# Patient Record
Sex: Female | Born: 1965 | Race: White | Hispanic: No | Marital: Married | Smoking: Never smoker
Health system: Southern US, Community
[De-identification: ages and names within clinical notes are randomized; demographics above are authoritative.]

## PROBLEM LIST (undated history)

## (undated) DIAGNOSIS — K279 Peptic ulcer, site unspecified, unspecified as acute or chronic, without hemorrhage or perforation: Secondary | ICD-10-CM

## (undated) DIAGNOSIS — Z9109 Other allergy status, other than to drugs and biological substances: Secondary | ICD-10-CM

## (undated) HISTORY — PX: BACK SURGERY: SHX140

---

## 1999-08-01 ENCOUNTER — Other Ambulatory Visit: Admission: RE | Admit: 1999-08-01 | Discharge: 1999-08-01 | Payer: Self-pay | Admitting: Obstetrics and Gynecology

## 2000-06-27 ENCOUNTER — Inpatient Hospital Stay (HOSPITAL_COMMUNITY): Admission: RE | Admit: 2000-06-27 | Discharge: 2000-06-28 | Payer: Self-pay | Admitting: Obstetrics and Gynecology

## 2001-08-24 ENCOUNTER — Other Ambulatory Visit: Admission: RE | Admit: 2001-08-24 | Discharge: 2001-08-24 | Payer: Self-pay | Admitting: Internal Medicine

## 2007-09-17 ENCOUNTER — Encounter: Admission: RE | Admit: 2007-09-17 | Discharge: 2007-09-17 | Payer: Self-pay | Admitting: Obstetrics and Gynecology

## 2007-09-22 ENCOUNTER — Encounter: Admission: RE | Admit: 2007-09-22 | Discharge: 2007-09-22 | Payer: Self-pay | Admitting: Obstetrics and Gynecology

## 2008-08-11 ENCOUNTER — Encounter: Admission: RE | Admit: 2008-08-11 | Discharge: 2008-08-11 | Payer: Self-pay | Admitting: Obstetrics and Gynecology

## 2011-01-06 ENCOUNTER — Encounter: Payer: Self-pay | Admitting: Obstetrics and Gynecology

## 2011-04-16 ENCOUNTER — Other Ambulatory Visit: Payer: Self-pay | Admitting: Obstetrics and Gynecology

## 2011-04-16 DIAGNOSIS — N6320 Unspecified lump in the left breast, unspecified quadrant: Secondary | ICD-10-CM

## 2011-04-18 ENCOUNTER — Ambulatory Visit
Admission: RE | Admit: 2011-04-18 | Discharge: 2011-04-18 | Disposition: A | Discharge status: Home / Self Care | Payer: BC Managed Care – PPO | Source: Ambulatory Visit | Attending: Obstetrics and Gynecology | Admitting: Obstetrics and Gynecology

## 2011-04-18 ENCOUNTER — Other Ambulatory Visit: Payer: Self-pay | Admitting: Obstetrics and Gynecology

## 2011-04-18 DIAGNOSIS — N6320 Unspecified lump in the left breast, unspecified quadrant: Secondary | ICD-10-CM

## 2012-03-26 ENCOUNTER — Other Ambulatory Visit: Payer: Self-pay | Admitting: Obstetrics and Gynecology

## 2012-03-26 DIAGNOSIS — Z1231 Encounter for screening mammogram for malignant neoplasm of breast: Secondary | ICD-10-CM

## 2012-04-21 ENCOUNTER — Ambulatory Visit
Admission: RE | Admit: 2012-04-21 | Discharge: 2012-04-21 | Disposition: A | Discharge status: Home / Self Care | Payer: BC Managed Care – PPO | Source: Ambulatory Visit | Attending: Obstetrics and Gynecology | Admitting: Obstetrics and Gynecology

## 2012-04-21 DIAGNOSIS — Z1231 Encounter for screening mammogram for malignant neoplasm of breast: Secondary | ICD-10-CM

## 2013-05-22 DIAGNOSIS — E669 Obesity, unspecified: Secondary | ICD-10-CM | POA: Insufficient documentation

## 2013-05-22 DIAGNOSIS — M51379 Other intervertebral disc degeneration, lumbosacral region without mention of lumbar back pain or lower extremity pain: Secondary | ICD-10-CM | POA: Insufficient documentation

## 2013-05-22 DIAGNOSIS — M545 Low back pain, unspecified: Secondary | ICD-10-CM | POA: Insufficient documentation

## 2013-05-22 DIAGNOSIS — R05 Cough: Secondary | ICD-10-CM | POA: Insufficient documentation

## 2013-05-22 DIAGNOSIS — J019 Acute sinusitis, unspecified: Secondary | ICD-10-CM | POA: Insufficient documentation

## 2013-05-22 DIAGNOSIS — M79609 Pain in unspecified limb: Secondary | ICD-10-CM | POA: Insufficient documentation

## 2013-05-22 DIAGNOSIS — G8929 Other chronic pain: Secondary | ICD-10-CM | POA: Insufficient documentation

## 2013-05-22 DIAGNOSIS — M539 Dorsopathy, unspecified: Secondary | ICD-10-CM | POA: Insufficient documentation

## 2013-05-22 DIAGNOSIS — J209 Acute bronchitis, unspecified: Secondary | ICD-10-CM | POA: Insufficient documentation

## 2013-05-22 DIAGNOSIS — J45909 Unspecified asthma, uncomplicated: Secondary | ICD-10-CM | POA: Insufficient documentation

## 2013-05-22 DIAGNOSIS — M5137 Other intervertebral disc degeneration, lumbosacral region: Secondary | ICD-10-CM | POA: Insufficient documentation

## 2013-05-22 DIAGNOSIS — R519 Headache, unspecified: Secondary | ICD-10-CM | POA: Insufficient documentation

## 2013-05-22 DIAGNOSIS — K219 Gastro-esophageal reflux disease without esophagitis: Secondary | ICD-10-CM | POA: Insufficient documentation

## 2013-05-22 DIAGNOSIS — M5417 Radiculopathy, lumbosacral region: Secondary | ICD-10-CM | POA: Insufficient documentation

## 2013-05-22 DIAGNOSIS — S93609A Unspecified sprain of unspecified foot, initial encounter: Secondary | ICD-10-CM | POA: Insufficient documentation

## 2013-05-22 DIAGNOSIS — M76899 Other specified enthesopathies of unspecified lower limb, excluding foot: Secondary | ICD-10-CM | POA: Insufficient documentation

## 2013-05-22 DIAGNOSIS — S39012A Strain of muscle, fascia and tendon of lower back, initial encounter: Secondary | ICD-10-CM | POA: Insufficient documentation

## 2013-07-14 ENCOUNTER — Other Ambulatory Visit: Payer: Self-pay

## 2013-07-14 DIAGNOSIS — Z1231 Encounter for screening mammogram for malignant neoplasm of breast: Secondary | ICD-10-CM

## 2013-08-12 ENCOUNTER — Ambulatory Visit: Payer: BC Managed Care – PPO

## 2013-09-01 ENCOUNTER — Ambulatory Visit
Admission: RE | Admit: 2013-09-01 | Discharge: 2013-09-01 | Disposition: A | Discharge status: Home / Self Care | Payer: BC Managed Care – PPO | Source: Ambulatory Visit

## 2013-09-01 DIAGNOSIS — Z1231 Encounter for screening mammogram for malignant neoplasm of breast: Secondary | ICD-10-CM

## 2014-01-11 ENCOUNTER — Other Ambulatory Visit: Payer: Self-pay | Admitting: Neurological Surgery

## 2014-01-11 DIAGNOSIS — M5416 Radiculopathy, lumbar region: Secondary | ICD-10-CM

## 2014-01-14 ENCOUNTER — Ambulatory Visit
Admission: RE | Admit: 2014-01-14 | Discharge: 2014-01-14 | Disposition: A | Discharge status: Home / Self Care | Payer: BC Managed Care – PPO | Source: Ambulatory Visit | Attending: Neurological Surgery | Admitting: Neurological Surgery

## 2014-01-14 VITALS — BP 141/85 | HR 83

## 2014-01-14 DIAGNOSIS — M5416 Radiculopathy, lumbar region: Secondary | ICD-10-CM

## 2014-01-14 MED ORDER — IOHEXOL 180 MG/ML  SOLN
1.0000 mL | Freq: Once | INTRAMUSCULAR | Status: AC | PRN
  Administered 2014-01-14: 1 mL via EPIDURAL

## 2014-01-14 MED ORDER — METHYLPREDNISOLONE ACETATE 40 MG/ML INJ SUSP (RADIOLOG
120.0000 mg | Freq: Once | INTRAMUSCULAR | Status: AC
  Administered 2014-01-14: 120 mg via EPIDURAL

## 2014-01-14 NOTE — Discharge Instructions (Signed)

## 2014-01-14 NOTE — Progress Notes (Deleted)
Patient states she has been off Citalopram, Phenergan and Tramadol for the past two days.

## 2014-07-16 DIAGNOSIS — K047 Periapical abscess without sinus: Secondary | ICD-10-CM | POA: Insufficient documentation

## 2014-11-25 ENCOUNTER — Other Ambulatory Visit: Payer: Self-pay | Admitting: Obstetrics and Gynecology

## 2014-11-25 DIAGNOSIS — R928 Other abnormal and inconclusive findings on diagnostic imaging of breast: Secondary | ICD-10-CM

## 2014-12-14 ENCOUNTER — Ambulatory Visit
Admission: RE | Admit: 2014-12-14 | Discharge: 2014-12-14 | Disposition: A | Discharge status: Home / Self Care | Payer: BC Managed Care – PPO | Source: Ambulatory Visit | Attending: Obstetrics and Gynecology | Admitting: Obstetrics and Gynecology

## 2014-12-14 DIAGNOSIS — R928 Other abnormal and inconclusive findings on diagnostic imaging of breast: Secondary | ICD-10-CM

## 2015-04-12 ENCOUNTER — Other Ambulatory Visit: Payer: Self-pay | Admitting: Neurological Surgery

## 2015-04-12 DIAGNOSIS — M5416 Radiculopathy, lumbar region: Secondary | ICD-10-CM

## 2015-04-21 ENCOUNTER — Other Ambulatory Visit: Payer: Self-pay

## 2015-04-24 ENCOUNTER — Other Ambulatory Visit: Payer: Self-pay

## 2015-05-05 ENCOUNTER — Ambulatory Visit
Admission: RE | Admit: 2015-05-05 | Discharge: 2015-05-05 | Disposition: A | Discharge status: Home / Self Care | Payer: BLUE CROSS/BLUE SHIELD | Source: Ambulatory Visit | Attending: Neurological Surgery | Admitting: Neurological Surgery

## 2015-05-05 ENCOUNTER — Ambulatory Visit
Admission: RE | Admit: 2015-05-05 | Discharge: 2015-05-05 | Disposition: A | Discharge status: Home / Self Care | Payer: Self-pay | Source: Ambulatory Visit | Attending: Neurological Surgery | Admitting: Neurological Surgery

## 2015-05-05 VITALS — BP 113/69 | HR 85

## 2015-05-05 DIAGNOSIS — M5416 Radiculopathy, lumbar region: Secondary | ICD-10-CM

## 2015-05-05 DIAGNOSIS — M5417 Radiculopathy, lumbosacral region: Principal | ICD-10-CM

## 2015-05-05 DIAGNOSIS — M5414 Radiculopathy, thoracic region: Secondary | ICD-10-CM

## 2015-05-05 MED ORDER — MEPERIDINE HCL 100 MG/ML IJ SOLN
75.0000 mg | Freq: Once | INTRAMUSCULAR | Status: AC
  Administered 2015-05-05: 75 mg via INTRAMUSCULAR

## 2015-05-05 MED ORDER — DIAZEPAM 5 MG PO TABS
10.0000 mg | ORAL_TABLET | Freq: Once | ORAL | Status: AC
  Administered 2015-05-05: 10 mg via ORAL

## 2015-05-05 MED ORDER — IOHEXOL 180 MG/ML  SOLN
15.0000 mL | Freq: Once | INTRAMUSCULAR | Status: AC | PRN
  Administered 2015-05-05: 15 mL via INTRATHECAL

## 2015-05-05 MED ORDER — ONDANSETRON HCL 4 MG/2ML IJ SOLN
4.0000 mg | Freq: Once | INTRAMUSCULAR | Status: AC
  Administered 2015-05-05: 4 mg via INTRAMUSCULAR

## 2015-05-05 NOTE — Discharge Instructions (Signed)

## 2015-11-15 ENCOUNTER — Emergency Department (HOSPITAL_COMMUNITY)
Admission: EM | Admit: 2015-11-15 | Discharge: 2015-11-15 | Disposition: A | Discharge status: Home / Self Care | Payer: BLUE CROSS/BLUE SHIELD | Source: Home / Self Care | Attending: Emergency Medicine | Admitting: Emergency Medicine

## 2015-11-15 ENCOUNTER — Encounter (HOSPITAL_COMMUNITY): Payer: Self-pay | Admitting: Emergency Medicine

## 2015-11-15 DIAGNOSIS — J018 Other acute sinusitis: Secondary | ICD-10-CM | POA: Diagnosis not present

## 2015-11-15 HISTORY — DX: Peptic ulcer, site unspecified, unspecified as acute or chronic, without hemorrhage or perforation: K27.9

## 2015-11-15 HISTORY — DX: Other allergy status, other than to drugs and biological substances: Z91.09

## 2015-11-15 MED ORDER — PREDNISONE 50 MG PO TABS: ORAL_TABLET | ORAL | Status: DC

## 2015-11-15 MED ORDER — ALBUTEROL SULFATE HFA 108 (90 BASE) MCG/ACT IN AERS: 2.0000 | INHALATION_SPRAY | RESPIRATORY_TRACT | Status: AC | PRN

## 2015-11-15 MED ORDER — FLUNISOLIDE 25 MCG/ACT (0.025%) NA SOLN: 2.0000 | Freq: Two times a day (BID) | NASAL | Status: AC

## 2015-11-15 MED ORDER — FLUTICASONE PROPIONATE 50 MCG/ACT NA SUSP: 2.0000 | Freq: Every day | NASAL | Status: DC

## 2015-11-15 MED ORDER — GUAIFENESIN ER 600 MG PO TB12: 600.0000 mg | ORAL_TABLET | Freq: Two times a day (BID) | ORAL | Status: AC

## 2015-11-15 MED ORDER — HYDROCODONE-HOMATROPINE 5-1.5 MG/5ML PO SYRP: 5.0000 mL | ORAL_SOLUTION | Freq: Four times a day (QID) | ORAL | Status: AC | PRN

## 2015-11-15 MED ORDER — ALBUTEROL SULFATE HFA 108 (90 BASE) MCG/ACT IN AERS: 2.0000 | INHALATION_SPRAY | RESPIRATORY_TRACT | Status: DC | PRN

## 2015-11-15 NOTE — ED Provider Notes (Signed)
CSN: [id number]     Arrival date & time 11/15/15  1259 History   First MD Initiated Contact with Patient 11/15/15 1320     Chief Complaint  Patient presents with  . Recurrent Sinusitis   (Consider location/radiation/quality/duration/timing/severity/associated sxs/prior Treatment) HPI She is a 49 year old woman here for evaluation of sinus infection. She states she gets about 2 sinus infections a year. Her primary care provider recently retired, and she does not have a new one yet. Her symptoms started 3 days ago with postnasal drainage, headache, and cough. She reports some wheezing at night. The cough is productive of a small amount of sputum. She reports subjective fevers yesterday and this morning. She denies any rhinorrhea. She does report a mild sore throat. She states her ears feel full.  She states her doctor typically gives her Mucinex, albuterol, and nasal spray, and prednisone.  Past Medical History  Diagnosis Date  . PUD (peptic ulcer disease)   . Environmental allergies    Past Surgical History  Procedure Laterality Date  . Back surgery     No family history on file. Social History  Substance Use Topics  . Smoking status: None  . Smokeless tobacco: None  . Alcohol Use: None   OB History    No data available     Review of Systems As in history of present illness Allergies  Review of patient's allergies indicates no known allergies.  Home Medications   Prior to Admission medications   Medication Sig Start Date End Date Taking? Authorizing Provider  albuterol (PROVENTIL HFA;VENTOLIN HFA) 108 (90 BASE) MCG/ACT inhaler Inhale 2 puffs into the lungs every 4 (four) hours as needed for wheezing or shortness of breath. 11/15/15   Charm RingsErin J Brytni Dray, MD  cetirizine (ZYRTEC) 10 MG tablet Take 10 mg by mouth. 11/24/14   Historical Provider, MD  estrogens, conjugated, (PREMARIN) 0.3 MG tablet Take 0.3 mg by mouth.    Historical Provider, MD  fluticasone (FLONASE) 50 MCG/ACT nasal  spray Place 2 sprays into both nostrils daily. 11/15/15   Charm RingsErin J Mitchelle Goerner, MD  guaiFENesin (MUCINEX) 600 MG 12 hr tablet Take 1 tablet (600 mg total) by mouth 2 (two) times daily. 11/15/15   Charm RingsErin J Erving Sassano, MD  HYDROcodone-homatropine (HYCODAN) 5-1.5 MG/5ML syrup Take 5 mLs by mouth every 6 (six) hours as needed for cough. 11/15/15   Charm RingsErin J Rosalba Totty, MD  omeprazole (PRILOSEC OTC) 20 MG tablet Take 20 mg by mouth. 11/24/14   Historical Provider, MD  predniSONE (DELTASONE) 50 MG tablet Take 1 pill daily for 5 days. 11/15/15   Charm RingsErin J Ohm Dentler, MD   Meds Ordered and Administered this Visit  Medications - No data to display  BP 144/100 mmHg  Pulse 117  Temp(Src) 99.4 F (37.4 C) (Oral)  Resp 18  SpO2 95% No data found.   Physical Exam  Constitutional: She is oriented to person, place, and time. She appears well-developed and well-nourished. No distress.  HENT:  Mouth/Throat: No oropharyngeal exudate.  Mild nasal erythema. Mild pharyngeal erythema. There is a moderate amount of clear postnasal drainage. TMs with clear effusions bilaterally. She has diffuse sinus tenderness.  Neck: Neck supple.  Cardiovascular: Regular rhythm and normal heart sounds.   No murmur heard. Mild tachycardia  Pulmonary/Chest: Effort normal and breath sounds normal. No respiratory distress. She has no wheezes. She has no rales.  Lymphadenopathy:    She has no cervical adenopathy.  Neurological: She is alert and oriented to person, place, and time.  ED Course  Procedures (including critical care time)  Labs Review Labs Reviewed - No data to display  Imaging Review No results found.    MDM   1. Other acute sinusitis    We'll go ahead and treat with Mucinex, Flonase, albuterol, and prednisone. Hycodan provided for cough. Follow-up as needed.    Charm Rings, MD 11/15/15 1340

## 2015-11-15 NOTE — Discharge Instructions (Signed)
A sinus infection. I do not see any signs of the flu. Use the Mucinex twice a day for the next week. Use Flonase daily for the next week. Take the prednisone as prescribed. Use albuterol every 4 hours as needed for wheezing. Use the Hycodan every 4 hours as needed for cough. Do not drive while taking this medicine. You should see improvement over the next 2-3 days. Follow-up as needed.

## 2015-12-06 ENCOUNTER — Emergency Department (HOSPITAL_COMMUNITY)
Admission: EM | Admit: 2015-12-06 | Discharge: 2015-12-06 | Disposition: A | Discharge status: Home / Self Care | Payer: BLUE CROSS/BLUE SHIELD | Source: Home / Self Care | Attending: Family Medicine | Admitting: Family Medicine

## 2015-12-06 ENCOUNTER — Emergency Department (INDEPENDENT_AMBULATORY_CARE_PROVIDER_SITE_OTHER): Payer: BLUE CROSS/BLUE SHIELD

## 2015-12-06 ENCOUNTER — Encounter (HOSPITAL_COMMUNITY): Payer: Self-pay | Admitting: *Deleted

## 2015-12-06 DIAGNOSIS — J069 Acute upper respiratory infection, unspecified: Secondary | ICD-10-CM

## 2015-12-06 MED ORDER — HYDROCOD POLST-CPM POLST ER 10-8 MG/5ML PO SUER: 5.0000 mL | Freq: Two times a day (BID) | ORAL | Status: AC | PRN

## 2015-12-06 MED ORDER — IPRATROPIUM BROMIDE 0.06 % NA SOLN: 2.0000 | Freq: Four times a day (QID) | NASAL | Status: AC

## 2015-12-06 MED ORDER — PREDNISONE 50 MG PO TABS: ORAL_TABLET | ORAL | Status: AC

## 2015-12-06 NOTE — ED Notes (Signed)
Pt  States   She   Was  Seen  sev  Weeks  Ago  For a  Sinus  Infection      She  Reports    Facial  Pain        Pressure      And  Drainage         she  Completed     Her  Treatment  Plan    And  Still  Has  Symptoms

## 2015-12-06 NOTE — ED Provider Notes (Signed)
CSN: [id number]     Arrival date & time 12/06/15  1408 History   First MD Initiated Contact with Patient 12/06/15 1518     Chief Complaint  Patient presents with  . Facial Pain   (Consider location/radiation/quality/duration/timing/severity/associated sxs/prior Treatment) Patient is a 49 y.o. female presenting with URI. The history is provided by the patient.  URI Presenting symptoms: congestion, facial pain, fever and rhinorrhea   Presenting symptoms: no sore throat   Severity:  Moderate Onset quality:  Gradual Duration:  3 weeks Progression:  Unchanged Chronicity:  Recurrent Ineffective treatments:  Prescription medications Associated symptoms: sinus pain   Risk factors comment:  Smoker   Past Medical History  Diagnosis Date  . PUD (peptic ulcer disease)   . Environmental allergies    Past Surgical History  Procedure Laterality Date  . Back surgery     History reviewed. No pertinent family history. Social History  Substance Use Topics  . Smoking status: Never Smoker   . Smokeless tobacco: None  . Alcohol Use: Yes   OB History    No data available     Review of Systems  Constitutional: Positive for fever.  HENT: Positive for congestion, postnasal drip, rhinorrhea and sinus pressure. Negative for sore throat.   All other systems reviewed and are negative.   Allergies  Review of patient's allergies indicates no known allergies.  Home Medications   Prior to Admission medications   Medication Sig Start Date End Date Taking? Authorizing Provider  albuterol (PROAIR HFA) 108 (90 BASE) MCG/ACT inhaler Inhale 2 puffs into the lungs every 4 (four) hours as needed for wheezing or shortness of breath. 11/15/15   Charm RingsErin J Honig, MD  cetirizine (ZYRTEC) 10 MG tablet Take 10 mg by mouth. 11/24/14   Historical Provider, MD  chlorpheniramine-HYDROcodone (TUSSIONEX PENNKINETIC ER) 10-8 MG/5ML SUER Take 5 mLs by mouth every 12 (twelve) hours as needed for cough. 12/06/15   Linna HoffJames  D Reginia Battie, MD  estrogens, conjugated, (PREMARIN) 0.3 MG tablet Take 0.3 mg by mouth.    Historical Provider, MD  flunisolide (NASALIDE) 25 MCG/ACT (0.025%) SOLN Place 2 sprays into the nose 2 (two) times daily. 11/15/15   Charm RingsErin J Honig, MD  guaiFENesin (MUCINEX) 600 MG 12 hr tablet Take 1 tablet (600 mg total) by mouth 2 (two) times daily. 11/15/15   Charm RingsErin J Honig, MD  HYDROcodone-homatropine (HYCODAN) 5-1.5 MG/5ML syrup Take 5 mLs by mouth every 6 (six) hours as needed for cough. 11/15/15   Charm RingsErin J Honig, MD  ipratropium (ATROVENT) 0.06 % nasal spray Place 2 sprays into both nostrils 4 (four) times daily. 12/06/15   Linna HoffJames D Kastin Cerda, MD  omeprazole (PRILOSEC OTC) 20 MG tablet Take 20 mg by mouth. 11/24/14   Historical Provider, MD  predniSONE (DELTASONE) 50 MG tablet 1 tab daily for 2 days then 1/2 tab daily for 2 days. 12/06/15   Linna HoffJames D Virtie Bungert, MD   Meds Ordered and Administered this Visit  Medications - No data to display  BP 135/91 mmHg  Pulse 105  Temp(Src) 98.3 F (36.8 C) (Oral)  Resp 16  SpO2 97% No data found.   Physical Exam  Constitutional: She is oriented to person, place, and time. She appears well-developed and well-nourished. No distress.  HENT:  Right Ear: External ear normal.  Left Ear: External ear normal.  Mouth/Throat: Oropharynx is clear and moist.  Neck: Normal range of motion. Neck supple.  Cardiovascular: Normal rate, regular rhythm, normal heart sounds and intact distal pulses.  Pulmonary/Chest: Effort normal and breath sounds normal.  Lymphadenopathy:    She has no cervical adenopathy.  Neurological: She is alert and oriented to person, place, and time.  Skin: Skin is warm and dry.  Nursing note and vitals reviewed.   ED Course  Procedures (including critical care time)  Labs Review Labs Reviewed - No data to display  Imaging Review Dg Sinuses Complete  12/06/2015  CLINICAL DATA:  Sinus pressure. EXAM: PARANASAL SINUSES - COMPLETE 3 + VIEW COMPARISON:   None. FINDINGS: Paranasal sinuses are clear. Mastoids are clear. No acute bony abnormality. IMPRESSION: No acute abnormality.  Paranasal sinuses clear. Electronically Signed   By: Maisie Fus  Register   On: 12/06/2015 16:06     Visual Acuity Review  Right Eye Distance:   Left Eye Distance:   Bilateral Distance:    Right Eye Near:   Left Eye Near:    Bilateral Near:         MDM   1. URI (upper respiratory infection)        Linna Hoff, MD 12/06/15 [phone]

## 2015-12-18 NOTE — ED Notes (Signed)
Patient called and left message , asking for refill of streoids . Called patient, left message for her that if she is continuing to have problems, she needs to be re evaluated , and we are unable to honor her request

## 2017-07-30 IMAGING — DX DG SINUSES COMPLETE 3+V
4 series · 4 of 4 positions shown · non-contrast
Comparison: None.

CLINICAL DATA: Sinus pressure.

EXAM:
PARANASAL SINUSES - COMPLETE 3 + VIEW

[skull calldwell]
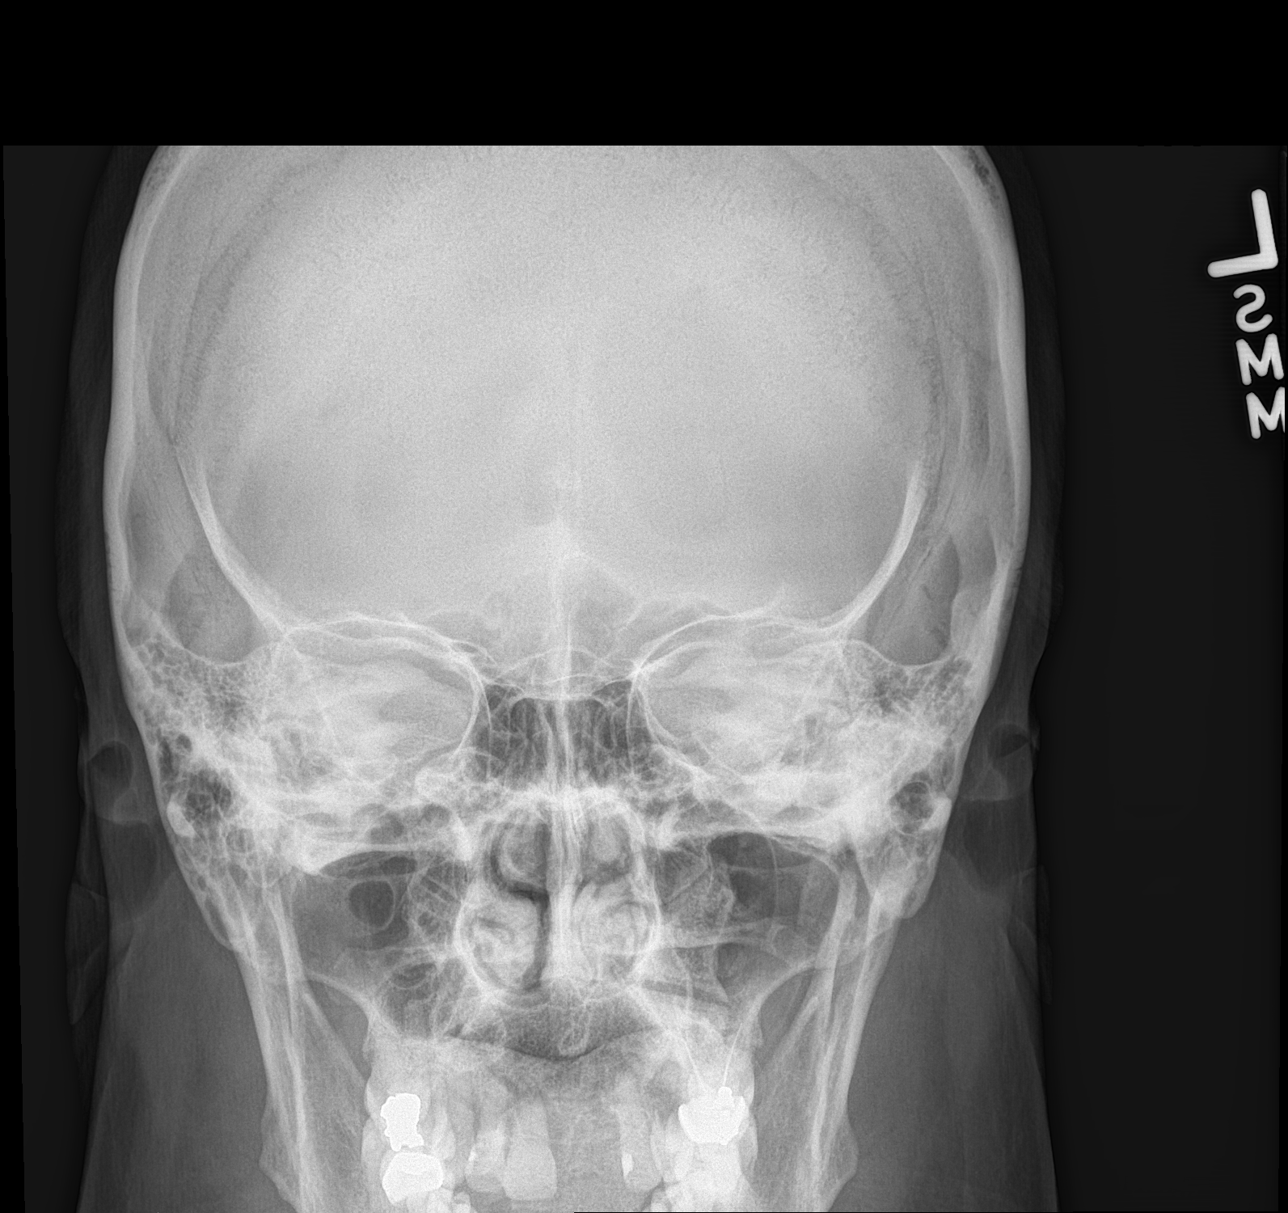

[skull waters]
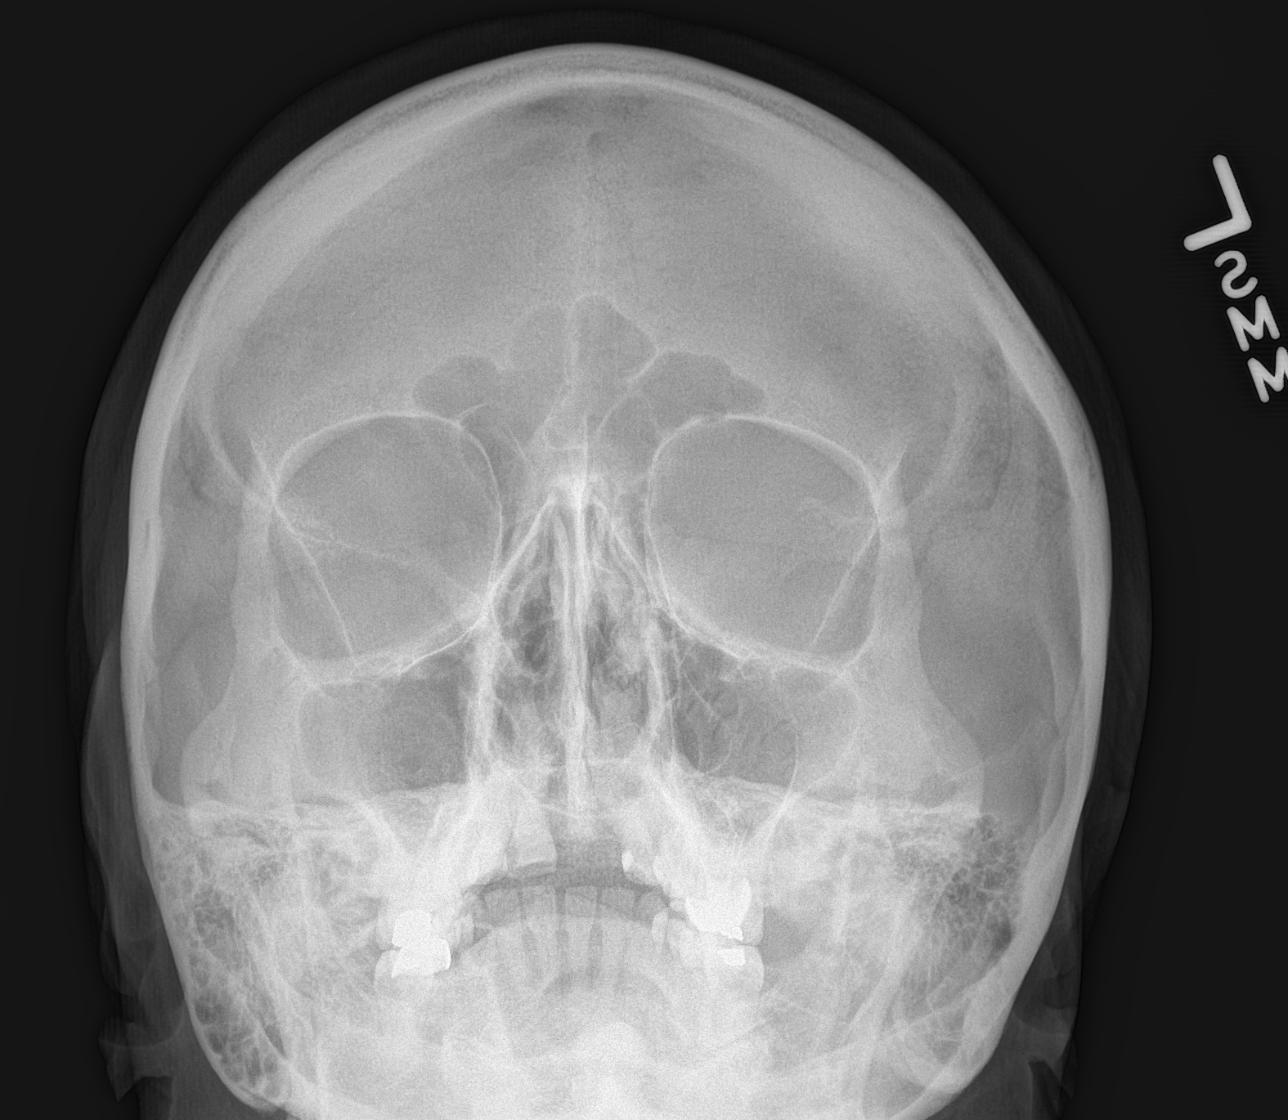

[skull smv]
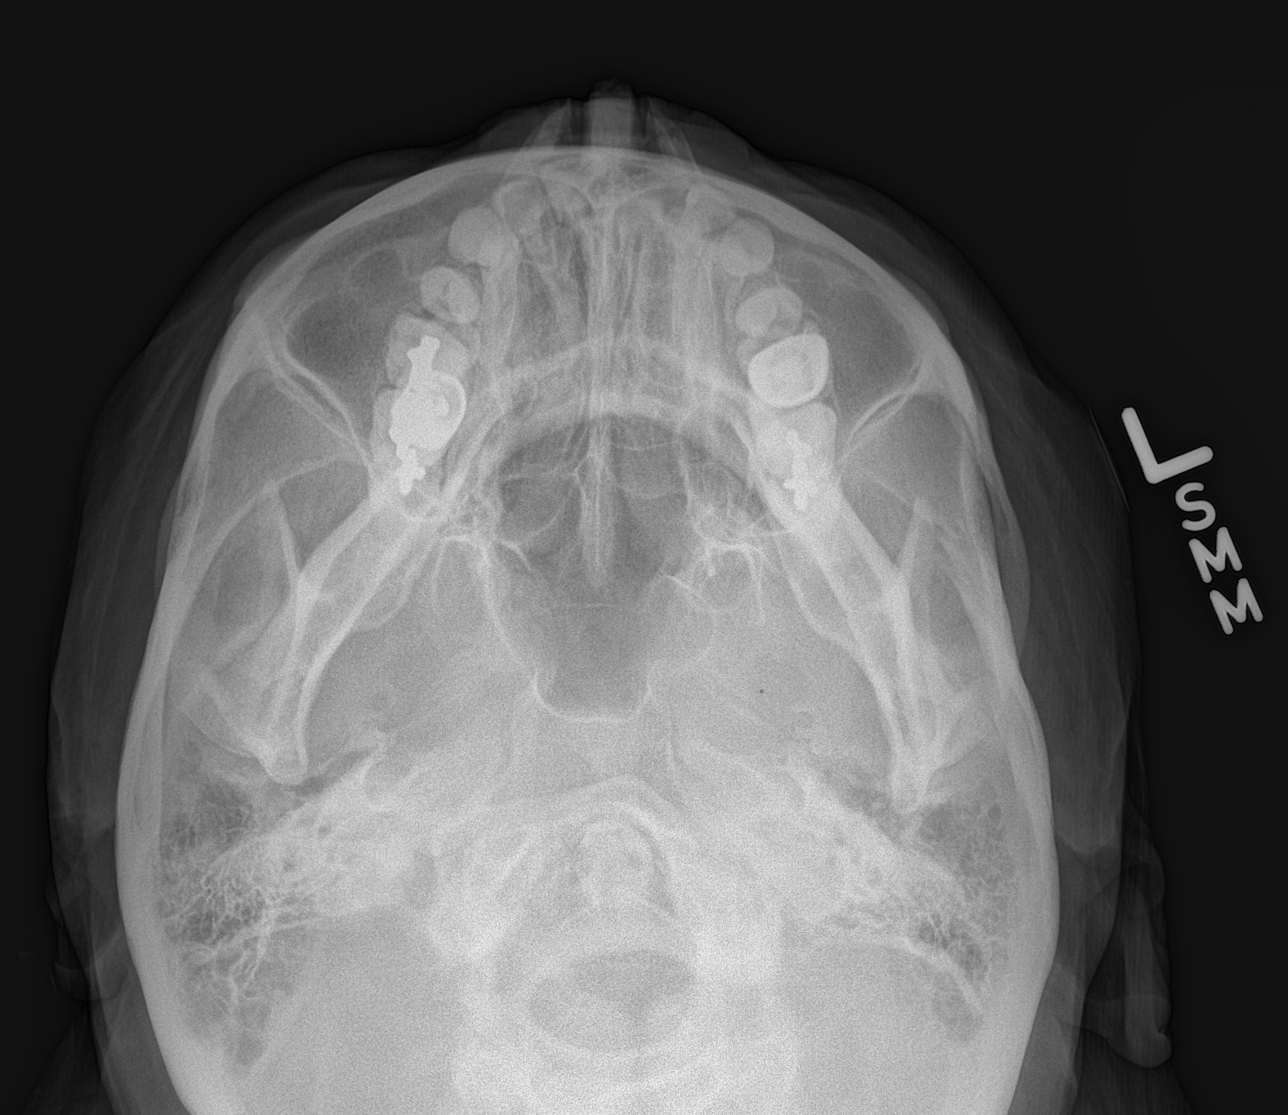

[skull lat]
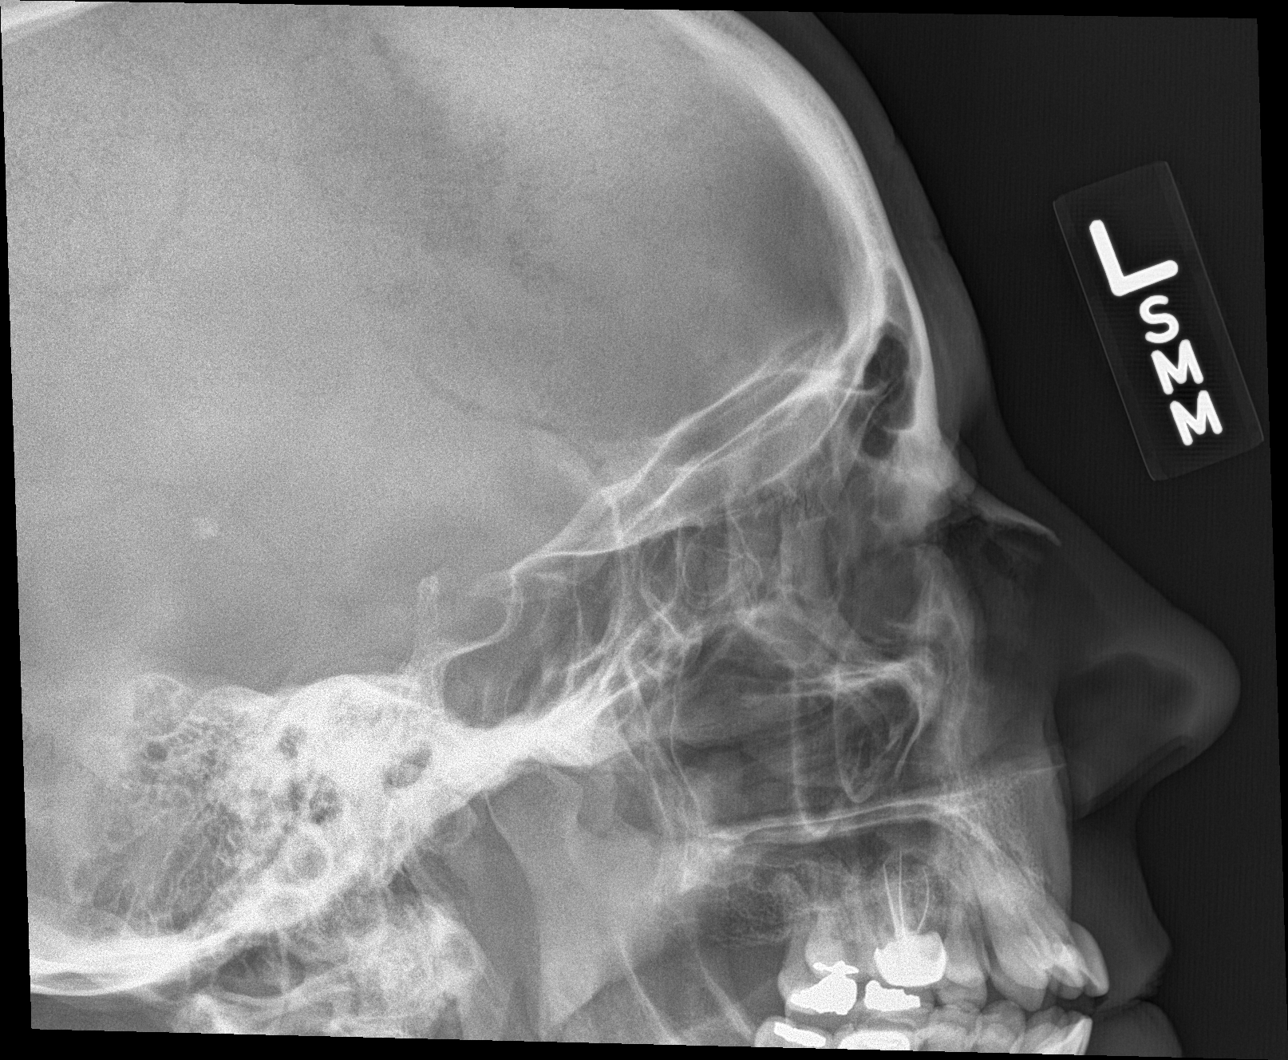

[4 of 4 positions shown; findings below may reference images not displayed]

FINDINGS: Paranasal sinuses are clear. Mastoids are clear. No acute bony
abnormality.
IMPRESSION: No acute abnormality.  Paranasal sinuses clear.
